# Patient Record
Sex: Female | Born: 1980 | Race: Black or African American | Hispanic: No | Marital: Single | Smoking: Former smoker
Health system: Southern US, Community
[De-identification: ages and names within clinical notes are randomized; demographics above are authoritative.]

## PROBLEM LIST (undated history)

## (undated) HISTORY — PX: UTERINE FIBROID SURGERY: SHX826

---

## 2016-10-08 ENCOUNTER — Emergency Department (HOSPITAL_COMMUNITY): Payer: Self-pay

## 2016-10-08 ENCOUNTER — Emergency Department (HOSPITAL_COMMUNITY)
Admission: EM | Admit: 2016-10-08 | Discharge: 2016-10-08 | Disposition: A | Discharge status: Home / Self Care | Payer: Self-pay | Attending: Emergency Medicine | Admitting: Emergency Medicine

## 2016-10-08 ENCOUNTER — Encounter (HOSPITAL_COMMUNITY): Payer: Self-pay

## 2016-10-08 DIAGNOSIS — Y929 Unspecified place or not applicable: Secondary | ICD-10-CM | POA: Insufficient documentation

## 2016-10-08 DIAGNOSIS — M79671 Pain in right foot: Secondary | ICD-10-CM | POA: Insufficient documentation

## 2016-10-08 DIAGNOSIS — F17200 Nicotine dependence, unspecified, uncomplicated: Secondary | ICD-10-CM | POA: Insufficient documentation

## 2016-10-08 DIAGNOSIS — Y990 Civilian activity done for income or pay: Secondary | ICD-10-CM | POA: Insufficient documentation

## 2016-10-08 DIAGNOSIS — X501XXA Overexertion from prolonged static or awkward postures, initial encounter: Secondary | ICD-10-CM | POA: Insufficient documentation

## 2016-10-08 DIAGNOSIS — Y939 Activity, unspecified: Secondary | ICD-10-CM | POA: Insufficient documentation

## 2016-10-08 MED ORDER — MELOXICAM 15 MG PO TABS: 15.0000 mg | ORAL_TABLET | Freq: Every day | ORAL | 0 refills | Status: DC

## 2016-10-08 NOTE — Progress Notes (Signed)
Orthopedic Tech Progress Note Patient Details:  Kelsey Boyd 21-Apr-1981 [id number]  Ortho Devices Type of Ortho Device: Crutches, CAM walker Ortho Device/Splint Location: rle Ortho Device/Splint Interventions: Application   Kelsey Boyd 10/08/2016, 3:12 PM

## 2016-10-08 NOTE — Discharge Instructions (Signed)
SEEK MEDICAL CARE IF:  You have pain in your foot or lower leg that gets worse or does not improve with medicine.  You have pain or difficulty when walking.  You have problems with your orthotics or your shoes.

## 2016-10-08 NOTE — ED Triage Notes (Signed)
Pt endorses right foot pain x 2 weeks, CMS and cap refill normal. VSS. Pt has been using icy hot without relief. Pt has relief with rest. Pt ambulatory and has full ROM of foot.

## 2016-10-08 NOTE — ED Notes (Signed)
Asked patient if she wanted ice for her foot and she stated that we could but she didn't think it would help and didn't really want it

## 2016-10-08 NOTE — ED Notes (Signed)
Papers reviewed with patient. Crutches and cam walker given by ortho tech and she expresses understanding of both aids

## 2016-10-08 NOTE — ED Provider Notes (Signed)
[facility]-EMERGENCY DEPT Provider Note   CSN: [id number] Arrival date & time: 10/08/16  1048  By signing my name below, I, Kelsey Boyd, attest that this documentation has been prepared under the direction and in the presence of Kelsey CaptainAbigail Talton Delpriore, PA-C. Electronically Signed: Freida Busmaniana Boyd, Scribe. 10/08/2016. 2:14 PM.  History   Chief Complaint Chief Complaint  Patient presents with  . Foot Pain   The history is provided by the patient. No language interpreter was used.     HPI Comments:  Kelsey Boyd is a 36 y.o. female who presents to the Emergency Department complaining of  gradual onset, gradual worsening   pain to the right foot x 2-3 weeks. She reports associated pain to the right heel that is worse in the AM. Her pain is exacerbted by movement. She has applied icy hot without relief. Pt is on her feet all day at work and notes a lot of walking as well. Pt denies ankle pain, knee pain, numbness/weakness/tingling and fever. She also denies h/o same.   History reviewed. No pertinent past medical history.  There are no active problems to display for this patient.   Past Surgical History:  Procedure Laterality Date  . UTERINE FIBROID SURGERY      OB History    No data available       Home Medications    Prior to Admission medications   Medication Sig Start Date End Date Taking? Authorizing Provider  meloxicam (MOBIC) 15 MG tablet Take 1 tablet (15 mg total) by mouth daily. Take 1 daily with food. 10/08/16   Kelsey CaptainAbigail Faylinn Schwenn, PA-C    Family History History reviewed. No pertinent family history.  Social History Social History  Substance Use Topics  . Smoking status: Current Every Day Smoker    Packs/day: 0.50  . Smokeless tobacco: Never Used  . Alcohol use No     Allergies   Codeine   Review of Systems Review of Systems  Constitutional: Negative for fever.  Musculoskeletal: Positive for arthralgias and myalgias.  Neurological: Negative for weakness and  numbness.    Physical Exam Updated Vital Signs BP 114/82   Pulse 69   Temp 98 F (36.7 C) (Oral)   Resp 18   Ht 5\' 5"  (1.651 m)   Wt 88.5 kg   LMP  (Exact Date) Comment: post fibroid surgery, not having periods  SpO2 100%   BMI 32.45 kg/m   Physical Exam  Constitutional: She is oriented to person, place, and time. She appears well-developed and well-nourished. No distress.  HENT:  Head: Normocephalic.  Eyes: Conjunctivae are normal.  Neck: Normal range of motion. Neck supple.  Cardiovascular: Normal rate.   Pulmonary/Chest: Effort normal.  Abdominal: She exhibits no distension.  Musculoskeletal: Normal range of motion.  Tender over the dorsum of the right foot with mild swelling TTP without signs of infection TTP of the right heel Pain with passive plantar flexion   Neurological: She is alert and oriented to person, place, and time.  Skin: Skin is warm and dry.  Psychiatric: She has a normal mood and affect.  Nursing note and vitals reviewed.    ED Treatments / Results  DIAGNOSTIC STUDIES:  Oxygen Saturation is 100% on RA, normal by my interpretation.    COORDINATION OF CARE:  2:00 PM Discussed treatment plan with pt at bedside and pt agreed to plan.  Labs (all labs ordered are listed, but only abnormal results are displayed) Labs Reviewed - No data to display  EKG  EKG Interpretation None       Radiology No results found.  Procedures Procedures (including critical care time)  Medications Ordered in ED Medications - No data to display   Initial Impression / Assessment and Plan / ED Course  I have reviewed the triage vital signs and the nursing notes.  Pertinent labs & imaging results that were available during my care of the patient were reviewed by me and considered in my medical decision making (see chart for details).       Patient X-Ray negative for obvious fracture or dislocation.  Pt advised to follow up with orthopedics. Patient  given Cam walker while in ED, conservative therapy recommended and discussed. Patient will be discharged home & is agreeable with above plan. Returns precautions discussed. Pt appears safe for discharge.  Final Clinical Impressions(s) / ED Diagnoses   Final diagnoses:  Foot pain, right    New Prescriptions Discharge Medication List as of 10/08/2016  2:14 PM    START taking these medications   Details  meloxicam (MOBIC) 15 MG tablet Take 1 tablet (15 mg total) by mouth daily. Take 1 daily with food., Starting Wed 10/08/2016, Print        I personally performed the services described in this documentation, which was scribed in my presence. The recorded information has been reviewed and is accurate.       Kelsey Captain, PA-C 10/13/16 0125    Shaune Pollack, MD 10/13/16 [phone]

## 2017-07-13 IMAGING — DX DG FOOT COMPLETE 3+V*R*
3 series · 3 of 3 positions shown · non-contrast
Comparison: None.

CLINICAL DATA: 35-year-old female with right foot pain and swelling
for 2 weeks a especially in the third through fifth metatarsal
region. No known injury, but unable to weightbear. Initial
encounter.

EXAM:
RIGHT FOOT COMPLETE - 3+ VIEW

[foot ap]
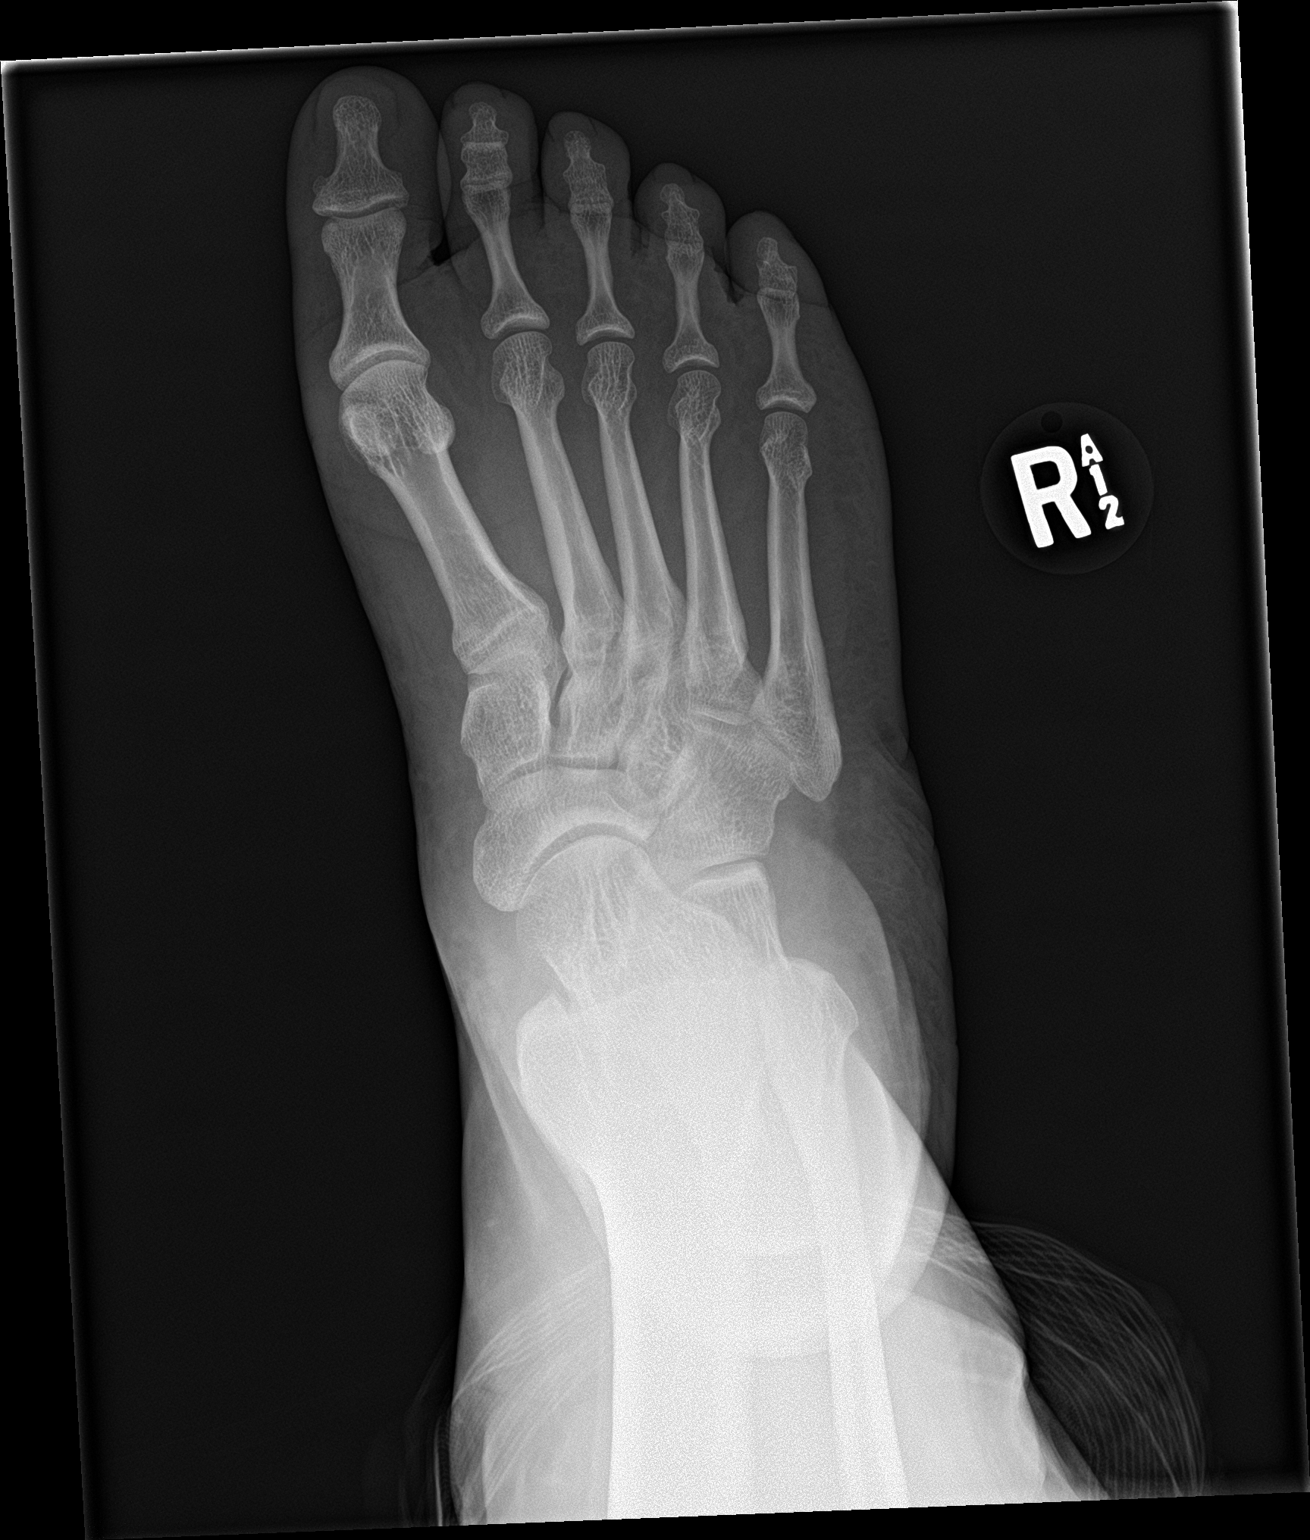

[foot obl]
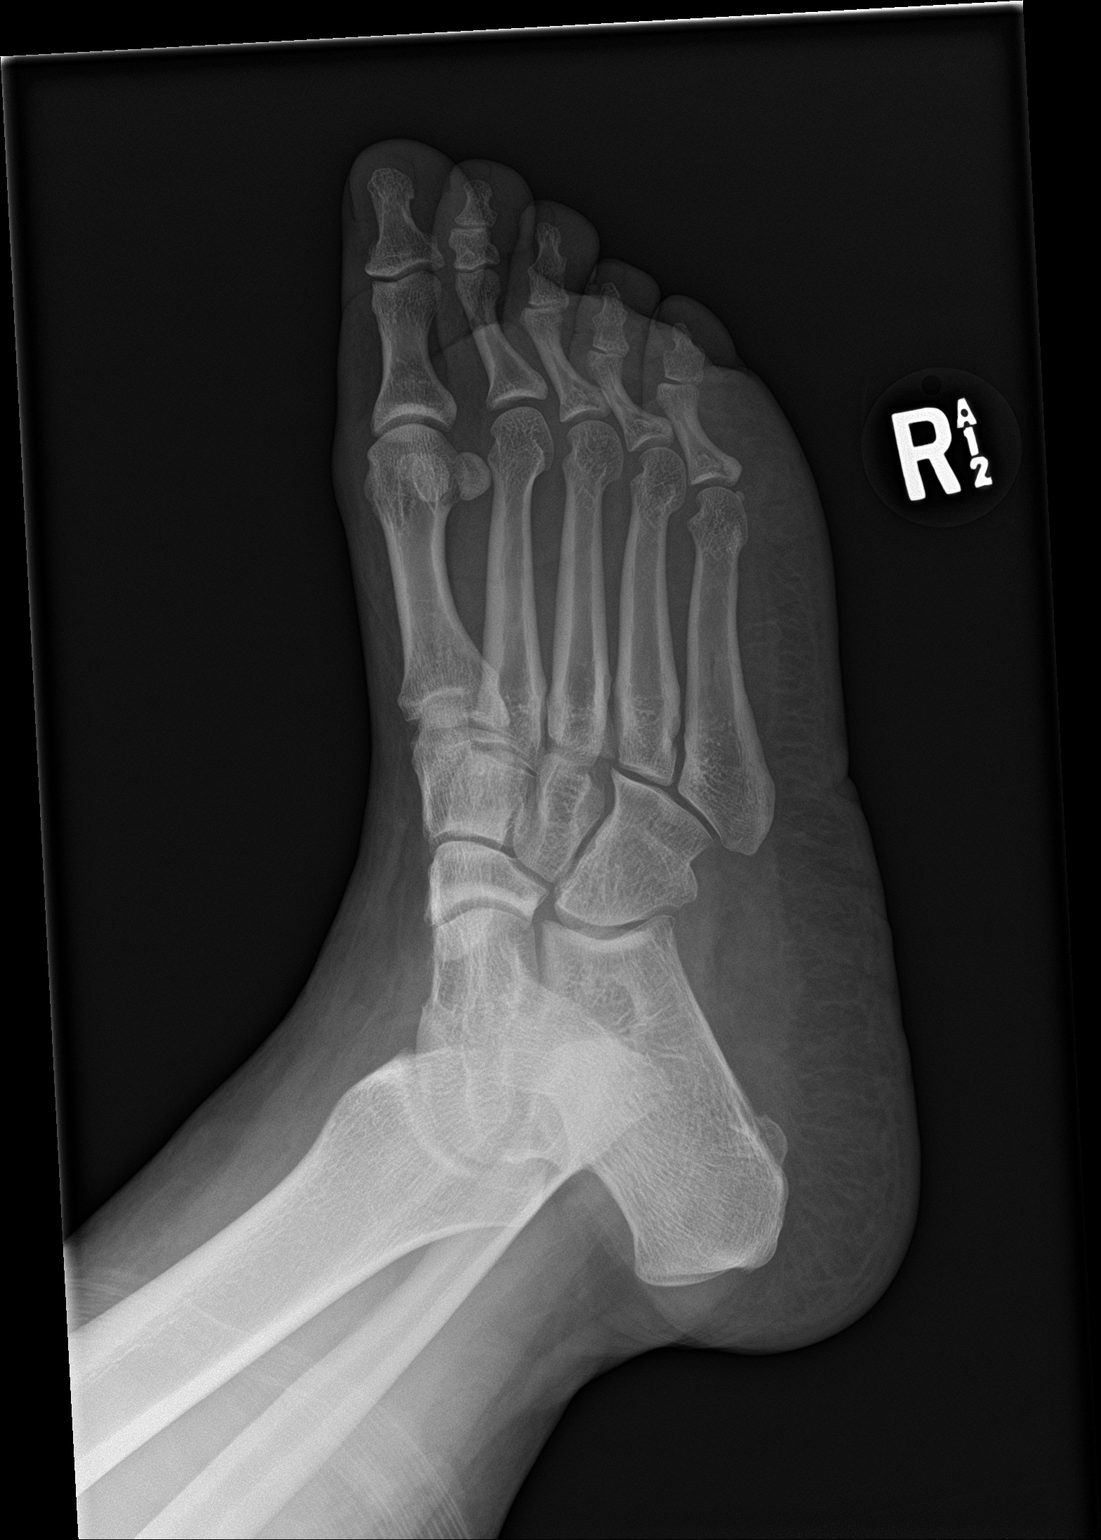

[foot lat]
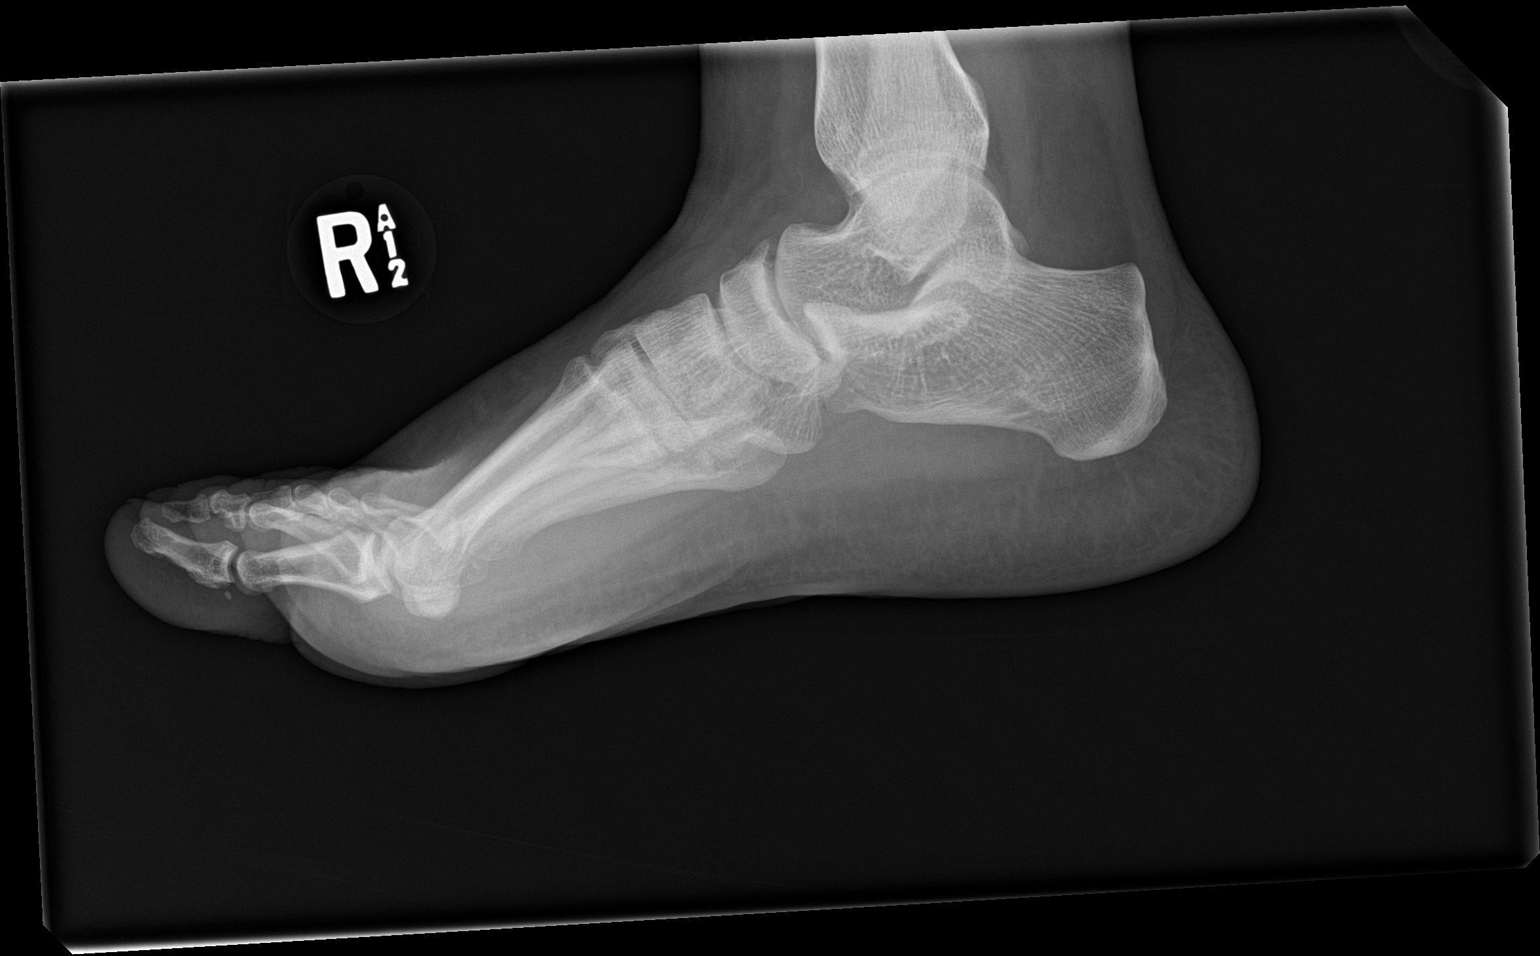

[3 of 3 positions shown; findings below may reference images not displayed]

FINDINGS: Bone mineralization is within normal limits. Calcaneus intact.
Tarsal bones appear intact and normally aligned. TMT joint alignment
and metatarsals are within normal limits. Phalanges and distal joint
spaces are normal. No subcutaneous gas. No radiopaque foreign body
identified.
IMPRESSION: No osseous abnormality identified in the right foot.

## 2020-04-18 ENCOUNTER — Ambulatory Visit (HOSPITAL_COMMUNITY)
Admission: EM | Admit: 2020-04-18 | Discharge: 2020-04-18 | Disposition: A | Discharge status: Home / Self Care | Payer: Self-pay | Attending: Family Medicine | Admitting: Family Medicine

## 2020-04-18 ENCOUNTER — Other Ambulatory Visit: Payer: Self-pay

## 2020-04-18 ENCOUNTER — Encounter (HOSPITAL_COMMUNITY): Payer: Self-pay | Admitting: Emergency Medicine

## 2020-04-18 DIAGNOSIS — Z885 Allergy status to narcotic agent status: Secondary | ICD-10-CM | POA: Insufficient documentation

## 2020-04-18 DIAGNOSIS — U071 COVID-19: Secondary | ICD-10-CM | POA: Insufficient documentation

## 2020-04-18 DIAGNOSIS — J069 Acute upper respiratory infection, unspecified: Secondary | ICD-10-CM

## 2020-04-18 DIAGNOSIS — Z87891 Personal history of nicotine dependence: Secondary | ICD-10-CM | POA: Insufficient documentation

## 2020-04-18 NOTE — Discharge Instructions (Addendum)
You have been tested for COVID-19 today. °If your test returns positive, you will receive a phone call from [facility] regarding your results. °Negative test results are not called. °Both positive and negative results area always visible on MyChart. °If you do not have a MyChart account, sign up instructions are provided in your discharge papers. °Please do not hesitate to contact us should you have questions or concerns. ° °

## 2020-04-18 NOTE — ED Triage Notes (Signed)
Patient has had symptoms for 2 days.  Cough, stuffy nose, sore throat, and back soreness

## 2020-04-18 NOTE — ED Provider Notes (Signed)
[facility]   [id number] 04/18/20 Arrival Time: 1238  ASSESSMENT & PLAN:  1. Viral URI with cough      COVID-19 testing sent. See letter/work note on file for self-isolation guidelines. OTC symptom care as needed.   Follow-up Information    [facility] at [facility].   Specialty: Urgent Care Why: As needed. Contact information: [address] [place] [address] [phone]              Reviewed expectations re: course of current medical issues. Questions answered. Outlined signs and symptoms indicating need for more acute intervention. Understanding verbalized. After Visit Summary given.   SUBJECTIVE: History from: patient. Kelsey Boyd is a 39 y.o. female who requests COVID-19 testing. Known COVID-19 contact: none. Recent travel: none. Reports: nasal congestion and cough for two days. Denies: fever and difficulty breathing. Normal PO intake without n/v/d.    OBJECTIVE:  Vitals:   04/18/20 1436  BP: (!) 137/91  Pulse: 73  Resp: 18  Temp: 98.7 F (37.1 C)  TempSrc: Oral  SpO2: 100%    General appearance: alert; no distress Eyes: PERRLA; EOMI; conjunctiva normal HENT: [place]; AT; nasal congestion Neck: supple  Lungs: speaks full sentences without difficulty; unlabored Extremities: no edema Skin: warm and dry Neurologic: normal gait Psychological: alert and cooperative; normal mood and affect  Labs: No results found for this or any previous visit. Labs Reviewed  SARS CORONAVIRUS 2 (TAT 6-24 HRS)      Allergies  Allergen Reactions  . Codeine Nausea And Vomiting    History reviewed. No pertinent past medical history. Social History   Socioeconomic History  . Marital status: Single    Spouse name: Not on file  . Number of children: Not on file  . Years of education: Not on file  . Highest education level: Not on file  Occupational History  . Not on file  Tobacco Use  . Smoking status: Former Smoker     Packs/day: 0.50  . Smokeless tobacco: Never Used  Substance and Sexual Activity  . Alcohol use: No  . Drug use: Yes    Types: Marijuana  . Sexual activity: Not Currently  Other Topics Concern  . Not on file  Social History Narrative  . Not on file   Social Determinants of Health   Financial Resource Strain:   . Difficulty of Paying Living Expenses:   Food Insecurity:   . Worried About Programme researcher, broadcasting/film/video in the Last Year:   . Barista in the Last Year:   Transportation Needs:   . Freight forwarder (Medical):   Marland Kitchen Lack of Transportation (Non-Medical):   Physical Activity:   . Days of Exercise per Week:   . Minutes of Exercise per Session:   Stress:   . Feeling of Stress :   Social Connections:   . Frequency of Communication with Friends and Family:   . Frequency of Social Gatherings with Friends and Family:   . Attends Religious Services:   . Active Member of Clubs or Organizations:   . Attends Banker Meetings:   Marland Kitchen Marital Status:   Intimate Partner Violence:   . Fear of Current or Ex-Partner:   . Emotionally Abused:   Marland Kitchen Physically Abused:   . Sexually Abused:    History reviewed. No pertinent family history. Past Surgical History:  Procedure Laterality Date  . UTERINE FIBROID SURGERY       Mardella Layman, MD 04/18/20  1446  

## 2020-04-19 LAB — SARS CORONAVIRUS 2 (TAT 6-24 HRS): SARS Coronavirus 2: POSITIVE — AB

## 2022-09-25 ENCOUNTER — Encounter (HOSPITAL_COMMUNITY): Payer: Self-pay | Admitting: Emergency Medicine

## 2022-09-25 ENCOUNTER — Ambulatory Visit (HOSPITAL_COMMUNITY)
Admission: EM | Admit: 2022-09-25 | Discharge: 2022-09-25 | Disposition: A | Discharge status: Home / Self Care | Payer: Self-pay | Attending: Internal Medicine | Admitting: Internal Medicine

## 2022-09-25 DIAGNOSIS — J111 Influenza due to unidentified influenza virus with other respiratory manifestations: Secondary | ICD-10-CM

## 2022-09-25 DIAGNOSIS — R112 Nausea with vomiting, unspecified: Secondary | ICD-10-CM

## 2022-09-25 MED ORDER — ONDANSETRON 4 MG PO TBDP: 4.0000 mg | ORAL_TABLET | Freq: Three times a day (TID) | ORAL | 0 refills | Status: AC | PRN

## 2022-09-25 MED ORDER — ONDANSETRON 4 MG PO TBDP
ORAL_TABLET | ORAL | Status: AC
  Filled 2022-09-25: qty 1

## 2022-09-25 MED ORDER — IBUPROFEN 800 MG PO TABS
ORAL_TABLET | ORAL | Status: AC
  Filled 2022-09-25: qty 1

## 2022-09-25 MED ORDER — ONDANSETRON 4 MG PO TBDP
4.0000 mg | ORAL_TABLET | Freq: Once | ORAL | Status: AC
  Administered 2022-09-25: 4 mg via ORAL

## 2022-09-25 MED ORDER — IBUPROFEN 800 MG PO TABS
800.0000 mg | ORAL_TABLET | Freq: Once | ORAL | Status: AC
  Administered 2022-09-25: 800 mg via ORAL

## 2022-09-25 MED ORDER — BENZONATATE 100 MG PO CAPS: 100.0000 mg | ORAL_CAPSULE | Freq: Three times a day (TID) | ORAL | 0 refills | Status: AC

## 2022-09-25 NOTE — Discharge Instructions (Addendum)
Your symptoms are most likely due to a viral illness, which will improve on its own with rest and fluids. COVID-19 testing is pending. If positive, stay home until day 6 of symptoms. Wear a mask until day 11.   We will treat your symptoms with the following: - Zofran 4mg  every 8 hours as needed to help with nausea and vomiting.  Eat a bland diet for the next 12-24 hours (bananas, rice, white toast, and applesauce) once you are able to tolerate liquids (broth, etc). These foods are easy for your stomach to digest. Pedialyte can be purchased to help with rehydration. Drink plenty of water.  - Ibuprofen 600mg  and/or tylenol 1,000mg  every 6 hours as needed for fever/chills and aches/pains. - Guaifenesin (Mucinex) may be purchased over the counter to be taken as directed to reduce nasal congestion/cough - Tessalon perles every 8 hours as needed for coughing  - Two teaspoons of honey (10 ml) can be given by mouth or in warm water every four hours as needed and may decrease the amount of coughing. - Use a cool mist humidifier in your room at night  If you develop any new or worsening symptoms or do not improve in the next 2 to 3 days, please return.  If your symptoms are severe, please go to the emergency room.  Follow-up with your primary care provider for further evaluation and management of your symptoms as well as ongoing wellness visits.  I hope you feel better!

## 2022-09-25 NOTE — ED Provider Notes (Signed)
[facility]    CSN: [id number] Arrival date & time: 09/25/22  0946      History   Chief Complaint Chief Complaint  Patient presents with   Generalized Body Aches    HPI Kelsey Boyd is a 42 y.o. female.   Patient presents urgent care for evaluation of generalized bodyaches, cough, chills, nausea, vomiting, and nasal congestion that started abruptly yesterday.  No known sick contacts with similar symptoms.  Cough is dry and worse at nighttime.  No history of asthma or other chronic respiratory problems.  She is an intermittent cigarette smoker but has not had any cigarettes recently.  No recent antibiotic or steroid use.  No shortness of breath, chest pain, heart palpitations, weakness, diarrhea, abdominal pain, urinary symptoms, flank pain, low back pain, or documented fever at home.  She has had 2 episodes of nonbilious/nonbloody emesis over the last 24 hours and is currently nauseous.  She has been able to keep down ginger ale without vomiting since arrival urgent care.  She also reports a generalized headache without blurry vision or dizziness.  She has not attempted use of any over-the-counter medications to help with symptoms prior to arrival urgent care.     History reviewed. No pertinent past medical history.  There are no problems to display for this patient.   Past Surgical History:  Procedure Laterality Date   UTERINE FIBROID SURGERY      OB History   No obstetric history on file.      Home Medications    Prior to Admission medications   Medication Sig Start Date End Date Taking? Authorizing Provider  benzonatate (TESSALON) 100 MG capsule Take 1 capsule (100 mg total) by mouth every 8 (eight) hours. 09/25/22  Yes Talbot Grumbling, FNP  ondansetron (ZOFRAN-ODT) 4 MG disintegrating tablet Take 1 tablet (4 mg total) by mouth every 8 (eight) hours as needed for nausea or vomiting. 09/25/22  Yes Saydi Kobel, Stasia Cavalier, FNP    Family History No  family history on file.  Social History Social History   Tobacco Use   Smoking status: Former    Packs/day: 0.50    Types: Cigarettes   Smokeless tobacco: Never  Substance Use Topics   Alcohol use: No   Drug use: Yes    Types: Marijuana     Allergies   Codeine   Review of Systems Review of Systems Per HPI  Physical Exam Triage Vital Signs ED Triage Vitals  Enc Vitals Group     BP 09/25/22 1125 129/88     Pulse Rate 09/25/22 1125 76     Resp 09/25/22 1125 15     Temp 09/25/22 1125 98 F (36.7 C)     Temp Source 09/25/22 1125 Oral     SpO2 09/25/22 1125 99 %     Weight --      Height --      Head Circumference --      Peak Flow --      Pain Score 09/25/22 1124 10     Pain Loc --      Pain Edu? --      Excl. in [place]? --    No data found.  Updated Vital Signs BP 129/88 (BP Location: Left Arm)   Pulse 76   Temp 98 F (36.7 C) (Oral)   Resp 15   SpO2 99%   Visual Acuity Right Eye Distance:   Left Eye Distance:   Bilateral Distance:    Right  Eye Near:   Left Eye Near:    Bilateral Near:     Physical Exam Vitals and nursing note reviewed.  Constitutional:      Appearance: She is not ill-appearing or toxic-appearing.  HENT:     Head: Normocephalic and atraumatic.     Right Ear: Hearing, tympanic membrane, ear canal and external ear normal.     Left Ear: Hearing, tympanic membrane, ear canal and external ear normal.     Nose: Nose normal.     Mouth/Throat:     Lips: Pink.     Mouth: Mucous membranes are moist.     Pharynx: Posterior oropharyngeal erythema present.  Eyes:     General: Lids are normal. Vision grossly intact. Gaze aligned appropriately.        Right eye: No discharge.        Left eye: No discharge.     Extraocular Movements: Extraocular movements intact.     Conjunctiva/sclera: Conjunctivae normal.     Pupils: Pupils are equal, round, and reactive to light.  Cardiovascular:     Rate and Rhythm: Normal rate and regular rhythm.      Heart sounds: Normal heart sounds, S1 normal and S2 normal.  Pulmonary:     Effort: Pulmonary effort is normal. No respiratory distress.     Breath sounds: Normal breath sounds and air entry.  Abdominal:     General: Abdomen is flat. Bowel sounds are normal.     Palpations: Abdomen is soft.     Tenderness: There is no abdominal tenderness. There is no guarding.  Musculoskeletal:     Cervical back: Neck supple.  Lymphadenopathy:     Cervical: No cervical adenopathy.  Skin:    General: Skin is warm and dry.     Capillary Refill: Capillary refill takes less than 2 seconds.     Findings: No rash.  Neurological:     General: No focal deficit present.     Mental Status: She is alert and oriented to person, place, and time. Mental status is at baseline.     Cranial Nerves: No dysarthria or facial asymmetry.  Psychiatric:        Mood and Affect: Mood normal.        Speech: Speech normal.        Behavior: Behavior normal.        Thought Content: Thought content normal.        Judgment: Judgment normal.      UC Treatments / Results  Labs (all labs ordered are listed, but only abnormal results are displayed) Labs Reviewed - No data to display  EKG   Radiology No results found.  Procedures Procedures (including critical care time)  Medications Ordered in UC Medications  ondansetron (ZOFRAN-ODT) disintegrating tablet 4 mg (4 mg Oral Given 09/25/22 1204)  ibuprofen (ADVIL) tablet 800 mg (800 mg Oral Given 09/25/22 1204)    Initial Impression / Assessment and Plan / UC Course  I have reviewed the triage vital signs and the nursing notes.  Pertinent labs & imaging results that were available during my care of the patient were reviewed by me and considered in my medical decision making (see chart for details).  Influenza-like illness, nausea and vomiting Symptoms and physical exam consistent with a viral upper respiratory tract infection that will likely resolve with rest,  fluids, and prescriptions for symptomatic relief. Deferred imaging based on stable cardiopulmonary exam and hemodynamically stable vital signs. COVID-19 testing is pending.  We will  call patient if this is positive.  Quarantine guidelines discussed. Currently on day 2 of symptoms and does not qualify for antiviral therapy.   Patient given Zofran 4 mg ODT and ibuprofen 800 mg in clinic today for nausea and headache.   Tessalon Perles and Zofran sent to pharmacy for symptomatic relief to be taken as prescribed.  May continue taking over the counter medications as directed for further symptomatic relief.   Nonpharmacologic interventions for symptom relief provided and after visit summary below. Advised to push fluids to stay well hydrated while recovering from viral illness.   Discussed physical exam and available lab work findings in clinic with patient.  Counseled patient regarding appropriate use of medications and potential side effects for all medications recommended or prescribed today. Discussed red flag signs and symptoms of worsening condition,when to call the PCP office, return to urgent care, and when to seek higher level of care in the emergency department. Patient verbalizes understanding and agreement with plan. All questions answered. Patient discharged in stable condition.    Final Clinical Impressions(s) / UC Diagnoses   Final diagnoses:  Influenza-like illness  Nausea and vomiting, unspecified vomiting type     Discharge Instructions      Your symptoms are most likely due to a viral illness, which will improve on its own with rest and fluids. COVID-19 testing is pending. If positive, stay home until day 6 of symptoms. Wear a mask until day 11.   We will treat your symptoms with the following: - Zofran 4mg  every 8 hours as needed to help with nausea and vomiting.  Eat a bland diet for the next 12-24 hours (bananas, rice, white toast, and applesauce) once you are able to  tolerate liquids (broth, etc). These foods are easy for your stomach to digest. Pedialyte can be purchased to help with rehydration. Drink plenty of water.  - Ibuprofen 600mg  and/or tylenol 1,000mg  every 6 hours as needed for fever/chills and aches/pains. - Guaifenesin (Mucinex) may be purchased over the counter to be taken as directed to reduce nasal congestion/cough - Tessalon perles every 8 hours as needed for coughing  - Two teaspoons of honey (10 ml) can be given by mouth or in warm water every four hours as needed and may decrease the amount of coughing. - Use a cool mist humidifier in your room at night  If you develop any new or worsening symptoms or do not improve in the next 2 to 3 days, please return.  If your symptoms are severe, please go to the emergency room.  Follow-up with your primary care provider for further evaluation and management of your symptoms as well as ongoing wellness visits.  I hope you feel better!     ED Prescriptions     Medication Sig Dispense Auth. Provider   benzonatate (TESSALON) 100 MG capsule Take 1 capsule (100 mg total) by mouth every 8 (eight) hours. 21 capsule M, FNP   ondansetron (ZOFRAN-ODT) 4 MG disintegrating tablet Take 1 tablet (4 mg total) by mouth every 8 (eight) hours as needed for nausea or vomiting. 20 tablet Reita May, FNP      PDMP not reviewed this encounter.   M, Carlisle Beers 09/25/22 1217

## 2022-09-25 NOTE — ED Triage Notes (Signed)
Pt got sent home from work yesterday due to having diarrhea, congestion, body aches, headache and chills,  Took tylenol yesterday without relief.
